# Patient Record
Sex: Female | Born: 1965
Health system: Southern US, Community
[De-identification: ages and names within clinical notes are randomized; demographics above are authoritative.]

## PROBLEM LIST (undated history)

## (undated) DIAGNOSIS — E785 Hyperlipidemia, unspecified: Secondary | ICD-10-CM

## (undated) DIAGNOSIS — C801 Malignant (primary) neoplasm, unspecified: Secondary | ICD-10-CM

## (undated) HISTORY — DX: Hyperlipidemia, unspecified: E78.5

---

## 2010-12-28 ENCOUNTER — Ambulatory Visit: Payer: Self-pay | Admitting: Obstetrics and Gynecology

## 2012-04-01 ENCOUNTER — Ambulatory Visit: Payer: Self-pay | Admitting: Obstetrics and Gynecology

## 2016-08-03 ENCOUNTER — Encounter (INDEPENDENT_AMBULATORY_CARE_PROVIDER_SITE_OTHER): Payer: Self-pay

## 2016-08-03 ENCOUNTER — Encounter: Payer: Self-pay | Admitting: Family Medicine

## 2016-08-03 ENCOUNTER — Ambulatory Visit (INDEPENDENT_AMBULATORY_CARE_PROVIDER_SITE_OTHER): Payer: BLUE CROSS/BLUE SHIELD | Admitting: Family Medicine

## 2016-08-03 VITALS — BP 110/80 | HR 78 | Ht 66.0 in | Wt 156.0 lb

## 2016-08-03 DIAGNOSIS — Z7689 Persons encountering health services in other specified circumstances: Secondary | ICD-10-CM

## 2016-08-03 DIAGNOSIS — Z7189 Other specified counseling: Secondary | ICD-10-CM | POA: Diagnosis not present

## 2016-08-03 NOTE — Progress Notes (Signed)
Name: Sandra Daugherty   MRN: [id number]    DOB: 10-22-1966   Date:08/03/2016       Progress Note  Subjective  Chief Complaint  Chief Complaint  Patient presents with  . Establish Care    was using [facility] and they closed    Patient presents for establish maintenance.    No problem-specific Assessment & Plan notes found for this encounter.   Past Medical History:  Diagnosis Date  . Hyperlipidemia     History reviewed. No pertinent surgical history.  Family History  Problem Relation Age of Onset  . Cancer Mother   . Stroke Mother   . Cancer Father   . Heart disease Maternal Grandfather     Social History   Social History  . Marital status: Married    Spouse name: N/A  . Number of children: N/A  . Years of education: N/A   Occupational History  . Not on file.   Social History Main Topics  . Smoking status: Never Smoker  . Smokeless tobacco: Never Used  . Alcohol use Yes  . Drug use: No  . Sexual activity: Yes   Other Topics Concern  . Not on file   Social History Narrative  . No narrative on file    No Known Allergies   Review of Systems  Constitutional: Negative for chills, fever, malaise/fatigue and weight loss.  HENT: Negative for ear discharge, ear pain and sore throat.   Eyes: Negative for blurred vision.  Respiratory: Negative for cough, sputum production, shortness of breath and wheezing.   Cardiovascular: Negative for chest pain, palpitations and leg swelling.  Gastrointestinal: Negative for abdominal pain, blood in stool, constipation, diarrhea, heartburn, melena and nausea.  Genitourinary: Negative for dysuria, frequency, hematuria and urgency.  Musculoskeletal: Negative for back pain, joint pain, myalgias and neck pain.  Skin: Negative for rash.  Neurological: Negative for dizziness, tingling, sensory change, focal weakness and headaches.  Endo/Heme/Allergies: Negative for environmental allergies and polydipsia. Does not  bruise/bleed easily.  Psychiatric/Behavioral: Negative for depression and suicidal ideas. The patient is not nervous/anxious and does not have insomnia.      Objective  Vitals:   08/03/16 1426  BP: 110/80  Pulse: 78  Weight: 156 lb (70.8 kg)  Height: 5\' 6"  (1.676 m)    Physical Exam  Constitutional: She is well-developed, well-nourished, and in no distress. No distress.  HENT:  Head: Normocephalic and atraumatic.  Right Ear: External ear normal.  Left Ear: External ear normal.  Nose: Nose normal.  Mouth/Throat: Oropharynx is clear and moist.  Eyes: Conjunctivae and EOM are normal. Pupils are equal, round, and reactive to light. Right eye exhibits no discharge. Left eye exhibits no discharge.  Neck: Normal range of motion. Neck supple. No JVD present. No thyromegaly present.  Cardiovascular: Normal rate, regular rhythm, normal heart sounds and intact distal pulses.  Exam reveals no gallop and no friction rub.   No murmur heard. Pulmonary/Chest: Effort normal and breath sounds normal. She has no wheezes. She has no rales.  Abdominal: Soft. Bowel sounds are normal. She exhibits no mass. There is no tenderness. There is no guarding.  Musculoskeletal: Normal range of motion. She exhibits no edema.  Lymphadenopathy:    She has no cervical adenopathy.  Neurological: She is alert. She has normal reflexes.  Skin: Skin is warm and dry. She is not diaphoretic.  Psychiatric: Mood and affect normal.  Nursing note and vitals reviewed.     Assessment & Plan  Problem List Items Addressed This Visit    None    Visit Diagnoses    Encounter to establish care    -  Primary        Dr. Otilio Miu [facility]  08/03/16

## 2016-08-16 ENCOUNTER — Encounter: Payer: Self-pay | Admitting: Family Medicine

## 2016-08-16 ENCOUNTER — Ambulatory Visit (INDEPENDENT_AMBULATORY_CARE_PROVIDER_SITE_OTHER): Payer: BLUE CROSS/BLUE SHIELD | Admitting: Family Medicine

## 2016-08-16 VITALS — BP 124/66 | HR 72 | Ht 66.0 in | Wt 156.0 lb

## 2016-08-16 DIAGNOSIS — Z1211 Encounter for screening for malignant neoplasm of colon: Secondary | ICD-10-CM | POA: Diagnosis not present

## 2016-08-16 DIAGNOSIS — Z0000 Encounter for general adult medical examination without abnormal findings: Secondary | ICD-10-CM | POA: Diagnosis not present

## 2016-08-16 DIAGNOSIS — Z23 Encounter for immunization: Secondary | ICD-10-CM | POA: Diagnosis not present

## 2016-08-16 DIAGNOSIS — Z1231 Encounter for screening mammogram for malignant neoplasm of breast: Secondary | ICD-10-CM | POA: Diagnosis not present

## 2016-08-16 DIAGNOSIS — Z1239 Encounter for other screening for malignant neoplasm of breast: Secondary | ICD-10-CM

## 2016-08-16 LAB — HEMOCCULT GUIAC POC 1CARD (OFFICE): Fecal Occult Blood, POC: NEGATIVE

## 2016-08-16 NOTE — Progress Notes (Signed)
Name: Sandra Daugherty   MRN: [id number]    DOB: [birth year]   Date:08/16/2016       Progress Note  Subjective  Chief Complaint  Chief Complaint  Patient presents with  . Annual Exam    no pap    Patient presents for annual physical exam.    No problem-specific Assessment & Plan notes found for this encounter.   Past Medical History:  Diagnosis Date  . Hyperlipidemia     History reviewed. No pertinent surgical history.  Family History  Problem Relation Age of Onset  . Cancer Mother   . Stroke Mother   . Cancer Father   . Heart disease Maternal Grandfather     Social History   Social History  . Marital status: Married    Spouse name: N/A  . Number of children: N/A  . Years of education: N/A   Occupational History  . Not on file.   Social History Main Topics  . Smoking status: Never Smoker  . Smokeless tobacco: Never Used  . Alcohol use Yes  . Drug use: No  . Sexual activity: Yes   Other Topics Concern  . Not on file   Social History Narrative  . No narrative on file    No Known Allergies   Review of Systems  Constitutional: Negative for chills, diaphoresis, fever, malaise/fatigue and weight loss.  HENT: Negative for congestion, ear discharge, ear pain, hearing loss, nosebleeds and tinnitus.   Eyes: Negative for blurred vision, double vision, photophobia, pain, discharge and redness.  Respiratory: Negative for cough, hemoptysis, sputum production, shortness of breath, wheezing and stridor.   Cardiovascular: Negative for chest pain, palpitations, orthopnea, claudication, leg swelling and PND.  Gastrointestinal: Negative for abdominal pain, blood in stool, constipation, diarrhea, heartburn, melena, nausea and vomiting.  Genitourinary: Negative for dysuria, flank pain, frequency, hematuria and urgency.  Musculoskeletal: Negative for back pain, falls, joint pain, myalgias and neck pain.  Skin: Negative for itching and rash.  Neurological: Negative  for dizziness, tingling, tremors, sensory change, speech change, focal weakness, seizures, loss of consciousness, weakness and headaches.  Endo/Heme/Allergies: Negative for environmental allergies and polydipsia. Does not bruise/bleed easily.  Psychiatric/Behavioral: Negative for depression, hallucinations, memory loss, substance abuse and suicidal ideas. The patient is not nervous/anxious and does not have insomnia.      Objective  Vitals:   08/16/16 0835  BP: 124/66  Pulse: 72  Weight: 156 lb (70.8 kg)  Height: 5\' 6"  (1.676 m)    Physical Exam  Constitutional: She is well-developed, well-nourished, and in no distress. No distress.  HENT:  Head: Normocephalic and atraumatic.  Right Ear: Tympanic membrane, external ear and ear canal normal.  Left Ear: Tympanic membrane, external ear and ear canal normal.  Nose: Nose normal.  Mouth/Throat: Uvula is midline, oropharynx is clear and moist and mucous membranes are normal.  Eyes: Conjunctivae, EOM and lids are normal. Pupils are equal, round, and reactive to light. Right eye exhibits no discharge. Left eye exhibits no discharge.  Fundoscopic exam:      The right eye shows no arteriolar narrowing, no AV nicking and no papilledema.       The left eye shows no arteriolar narrowing, no AV nicking and no papilledema.  Neck: Trachea normal, normal range of motion and phonation normal. Neck supple. Normal carotid pulses, no hepatojugular reflux and no JVD present. No spinous process tenderness present. Carotid bruit is not present. No thyroid mass and no thyromegaly present.  Cardiovascular: Normal rate,  regular rhythm, S1 normal and intact distal pulses.  Exam reveals no gallop, no S3, no S4, no distant heart sounds and no friction rub.   Murmur heard.  Systolic murmur is present with a grade of 1/6  Pulmonary/Chest: Effort normal and breath sounds normal. Right breast exhibits no inverted nipple, no mass, no nipple discharge, no skin change and  no tenderness. Left breast exhibits no inverted nipple, no mass, no nipple discharge, no skin change and no tenderness. Breasts are asymmetrical.  L>R  Right seems more dense  Abdominal: Soft. Bowel sounds are normal. She exhibits no mass. There is no hepatosplenomegaly, splenomegaly or hepatomegaly. There is no tenderness. There is no guarding and no CVA tenderness.  Genitourinary: Rectum normal. Rectal exam shows no external hemorrhoid.  Musculoskeletal: Normal range of motion. She exhibits no edema.  Lymphadenopathy:       Head (right side): No submental and no submandibular adenopathy present.       Head (left side): No submental and no submandibular adenopathy present.    She has no cervical adenopathy.    She has no axillary adenopathy.  Neurological: She is alert. She has normal strength, normal reflexes and intact cranial nerves.  Skin: Skin is warm and dry. She is not diaphoretic.  Psychiatric: Mood and affect normal.  Nursing note and vitals reviewed.     Assessment & Plan  Problem List Items Addressed This Visit    None    Visit Diagnoses    Annual physical exam    -  Primary   Relevant Orders   Flu Vaccine QUAD 36+ mos PF IM (Fluarix & Fluzone Quad PF) (Completed)   Renal Function Panel   Lipid Profile   Immunization due       Relevant Orders   Flu Vaccine QUAD 36+ mos PF IM (Fluarix & Fluzone Quad PF) (Completed)   Colon cancer screening       Relevant Orders   Ambulatory referral to Gastroenterology   POCT Occult Blood Stool (Completed)   Breast cancer screening       Relevant Orders   MM Digital Screening        Dr. Otilio Miu [facility]  08/16/16

## 2016-08-17 LAB — RENAL FUNCTION PANEL
ALBUMIN: 4.6 g/dL (ref 3.5–5.5)
BUN/Creatinine Ratio: 11 (ref 9–23)
BUN: 8 mg/dL (ref 6–24)
CALCIUM: 9.3 mg/dL (ref 8.7–10.2)
CO2: 26 mmol/L (ref 18–29)
CREATININE: 0.74 mg/dL (ref 0.57–1.00)
Chloride: 101 mmol/L (ref 96–106)
GFR calc Af Amer: 109 mL/min/{1.73_m2} (ref >59)
GFR, EST NON AFRICAN AMERICAN: 95 mL/min/{1.73_m2} (ref >59)
GLUCOSE: 77 mg/dL (ref 65–99)
PHOSPHORUS: 4.1 mg/dL (ref 2.5–4.5)
POTASSIUM: 4.5 mmol/L (ref 3.5–5.2)
Sodium: 142 mmol/L (ref 134–144)

## 2016-08-17 LAB — LIPID PANEL
CHOL/HDL RATIO: 4.4 ratio (ref 0.0–4.4)
CHOLESTEROL TOTAL: 224 mg/dL — AB (ref 100–199)
HDL: 51 mg/dL (ref >39)
LDL Calculated: 141 mg/dL — ABNORMAL HIGH (ref 0–99)
TRIGLYCERIDES: 159 mg/dL — AB (ref 0–149)
VLDL Cholesterol Cal: 32 mg/dL (ref 5–40)

## 2016-08-21 ENCOUNTER — Other Ambulatory Visit: Payer: Self-pay | Admitting: *Deleted

## 2016-08-21 ENCOUNTER — Inpatient Hospital Stay
Admission: RE | Admit: 2016-08-21 | Discharge: 2016-08-21 | Disposition: A | Discharge status: Home / Self Care | Payer: Self-pay | Source: Ambulatory Visit | Attending: *Deleted | Admitting: *Deleted

## 2016-08-21 DIAGNOSIS — Z9289 Personal history of other medical treatment: Secondary | ICD-10-CM

## 2016-08-29 ENCOUNTER — Ambulatory Visit
Admission: RE | Admit: 2016-08-29 | Discharge: 2016-08-29 | Disposition: A | Discharge status: Home / Self Care | Payer: BLUE CROSS/BLUE SHIELD | Source: Ambulatory Visit | Attending: Family Medicine | Admitting: Family Medicine

## 2016-08-29 DIAGNOSIS — Z1231 Encounter for screening mammogram for malignant neoplasm of breast: Secondary | ICD-10-CM | POA: Diagnosis not present

## 2016-08-29 DIAGNOSIS — Z1239 Encounter for other screening for malignant neoplasm of breast: Secondary | ICD-10-CM

## 2017-06-03 ENCOUNTER — Ambulatory Visit (INDEPENDENT_AMBULATORY_CARE_PROVIDER_SITE_OTHER): Payer: BLUE CROSS/BLUE SHIELD | Admitting: Family Medicine

## 2017-06-03 ENCOUNTER — Encounter: Payer: Self-pay | Admitting: Family Medicine

## 2017-06-03 VITALS — BP 112/80 | HR 80 | Ht 66.0 in | Wt 159.0 lb

## 2017-06-03 DIAGNOSIS — M21612 Bunion of left foot: Secondary | ICD-10-CM | POA: Diagnosis not present

## 2017-06-03 NOTE — Progress Notes (Signed)
Name: Sandra Daugherty   MRN: [id number]    DOB: 07-06-1966   Date:06/03/2017       Progress Note  Subjective  Chief Complaint  Chief Complaint  Patient presents with  . Foot Pain    has a bunion on outside of L) great toe that hurts when trying to wear shoes that enclose the foot    Foot Pain  This is a chronic problem. The current episode started more than 1 year ago. The problem occurs constantly. The problem has been gradually worsening. Associated symptoms include arthralgias and joint swelling. Pertinent negatives include no abdominal pain, chest pain, chills, coughing, fever, headaches, myalgias, nausea, neck pain, numbness, rash, sore throat or weakness. The symptoms are aggravated by standing, exertion and walking. She has tried NSAIDs and acetaminophen for the symptoms. The treatment provided no relief.    No problem-specific Assessment & Plan notes found for this encounter.   Past Medical History:  Diagnosis Date  . Hyperlipidemia     No past surgical history on file.  Family History  Problem Relation Age of Onset  . Cancer Mother   . Stroke Mother   . Cancer Father   . Heart disease Maternal Grandfather   . Breast cancer Maternal Grandmother     Social History   Social History  . Marital status: Married    Spouse name: N/A  . Number of children: N/A  . Years of education: N/A   Occupational History  . Not on file.   Social History Main Topics  . Smoking status: Never Smoker  . Smokeless tobacco: Never Used  . Alcohol use Yes  . Drug use: No  . Sexual activity: Yes   Other Topics Concern  . Not on file   Social History Narrative  . No narrative on file    No Known Allergies  No outpatient prescriptions prior to visit.   No facility-administered medications prior to visit.     Review of Systems  Constitutional: Negative for chills, fever, malaise/fatigue and weight loss.  HENT: Negative for ear discharge, ear pain and sore throat.     Eyes: Negative for blurred vision.  Respiratory: Negative for cough, sputum production, shortness of breath and wheezing.   Cardiovascular: Negative for chest pain, palpitations and leg swelling.  Gastrointestinal: Negative for abdominal pain, blood in stool, constipation, diarrhea, heartburn, melena and nausea.  Genitourinary: Negative for dysuria, frequency, hematuria and urgency.  Musculoskeletal: Positive for arthralgias, joint pain and joint swelling. Negative for back pain, falls, myalgias and neck pain.  Skin: Negative for rash.  Neurological: Negative for dizziness, tingling, sensory change, focal weakness, weakness, numbness and headaches.  Endo/Heme/Allergies: Negative for environmental allergies and polydipsia. Does not bruise/bleed easily.  Psychiatric/Behavioral: Negative for depression and suicidal ideas. The patient is not nervous/anxious and does not have insomnia.      Objective  Vitals:   06/03/17 1354  BP: 112/80  Pulse: 80  Weight: 159 lb (72.1 kg)  Height: 5\' 6"  (1.676 m)    Physical Exam  Constitutional: She is well-developed, well-nourished, and in no distress. No distress.  HENT:  Head: Normocephalic and atraumatic.  Right Ear: External ear normal.  Left Ear: External ear normal.  Nose: Nose normal.  Mouth/Throat: Oropharynx is clear and moist.  Eyes: Pupils are equal, round, and reactive to light. Conjunctivae and EOM are normal. Right eye exhibits no discharge. Left eye exhibits no discharge.  Neck: Normal range of motion. Neck supple. No JVD present. No thyromegaly  present.  Cardiovascular: Normal rate, regular rhythm, normal heart sounds and intact distal pulses.  Exam reveals no gallop and no friction rub.   No murmur heard. Pulmonary/Chest: Effort normal and breath sounds normal. She has no wheezes. She has no rales.  Abdominal: Soft. Bowel sounds are normal. She exhibits no mass. There is no tenderness. There is no guarding.  Musculoskeletal:  Normal range of motion. She exhibits no edema.       Feet:  Lymphadenopathy:    She has no cervical adenopathy.  Neurological: She is alert. She has normal reflexes.  Skin: Skin is warm and dry. She is not diaphoretic.  Psychiatric: Mood and affect normal.  Nursing note and vitals reviewed.     Assessment & Plan  Problem List Items Addressed This Visit    None    Visit Diagnoses    Bunion of great toe of left foot    -  Primary   Relevant Orders   Ambulatory referral to Podiatry      No orders of the defined types were placed in this encounter.     Dr. [facility]  06/03/17

## 2017-06-03 NOTE — Patient Instructions (Signed)

## 2018-02-10 ENCOUNTER — Encounter: Payer: Self-pay | Admitting: Family Medicine

## 2018-02-10 ENCOUNTER — Ambulatory Visit (INDEPENDENT_AMBULATORY_CARE_PROVIDER_SITE_OTHER): Payer: BLUE CROSS/BLUE SHIELD | Admitting: Family Medicine

## 2018-02-10 VITALS — BP 120/70 | HR 76 | Ht 66.0 in | Wt 160.0 lb

## 2018-02-10 DIAGNOSIS — E785 Hyperlipidemia, unspecified: Secondary | ICD-10-CM

## 2018-02-10 DIAGNOSIS — Z1211 Encounter for screening for malignant neoplasm of colon: Secondary | ICD-10-CM | POA: Diagnosis not present

## 2018-02-10 DIAGNOSIS — Z124 Encounter for screening for malignant neoplasm of cervix: Secondary | ICD-10-CM | POA: Diagnosis not present

## 2018-02-10 DIAGNOSIS — Z0000 Encounter for general adult medical examination without abnormal findings: Secondary | ICD-10-CM | POA: Diagnosis not present

## 2018-02-10 DIAGNOSIS — Z23 Encounter for immunization: Secondary | ICD-10-CM | POA: Diagnosis not present

## 2018-02-10 DIAGNOSIS — Z1239 Encounter for other screening for malignant neoplasm of breast: Secondary | ICD-10-CM

## 2018-02-10 DIAGNOSIS — Z1231 Encounter for screening mammogram for malignant neoplasm of breast: Secondary | ICD-10-CM | POA: Diagnosis not present

## 2018-02-10 NOTE — Progress Notes (Signed)
Name: Sandra Daugherty   MRN: [id number]    DOB: Jun 07, 1966   Date:02/10/2018       Progress Note  Subjective  Chief Complaint  Chief Complaint  Patient presents with  . Annual Exam    pap and mammo needed, ref to colonoscopy    Patient presents for annual physical with pap and pelvic/breast exams.   No problem-specific Assessment & Plan notes found for this encounter.   Past Medical History:  Diagnosis Date  . Hyperlipidemia     No past surgical history on file.  Family History  Problem Relation Age of Onset  . Cancer Mother   . Stroke Mother   . Cancer Father   . Heart disease Maternal Grandfather   . Breast cancer Maternal Grandmother     Social History   Socioeconomic History  . Marital status: Married    Spouse name: Not on file  . Number of children: Not on file  . Years of education: Not on file  . Highest education level: Not on file  Occupational History  . Not on file  Social Needs  . Financial resource strain: Not on file  . Food insecurity:    Worry: Not on file    Inability: Not on file  . Transportation needs:    Medical: Not on file    Non-medical: Not on file  Tobacco Use  . Smoking status: Never Smoker  . Smokeless tobacco: Never Used  Substance and Sexual Activity  . Alcohol use: Yes  . Drug use: No  . Sexual activity: Yes  Lifestyle  . Physical activity:    Days per week: Not on file    Minutes per session: Not on file  . Stress: Not on file  Relationships  . Social connections:    Talks on phone: Not on file    Gets together: Not on file    Attends religious service: Not on file    Active member of club or organization: Not on file    Attends meetings of clubs or organizations: Not on file    Relationship status: Not on file  . Intimate partner violence:    Fear of current or ex partner: Not on file    Emotionally abused: Not on file    Physically abused: Not on file    Forced sexual activity: Not on file  Other Topics  Concern  . Not on file  Social History Narrative  . Not on file    No Known Allergies  Outpatient Medications Prior to Visit  Medication Sig Dispense Refill  . calcium-vitamin D 250-100 MG-UNIT tablet Take 1 tablet by mouth 2 (two) times daily.    . Multiple Vitamin (MULTIVITAMIN) tablet Take 1 tablet by mouth daily.     No facility-administered medications prior to visit.     Review of Systems  Constitutional: Negative for chills, fever, malaise/fatigue and weight loss.  HENT: Negative for ear discharge, ear pain and sore throat.   Eyes: Negative for blurred vision.  Respiratory: Negative for cough, sputum production, shortness of breath and wheezing.   Cardiovascular: Negative for chest pain, palpitations and leg swelling.  Gastrointestinal: Negative for abdominal pain, blood in stool, constipation, diarrhea, heartburn, melena and nausea.  Genitourinary: Negative for dysuria, frequency, hematuria and urgency.  Musculoskeletal: Negative for back pain, joint pain, myalgias and neck pain.  Skin: Negative for rash.  Neurological: Negative for dizziness, tingling, sensory change, focal weakness and headaches.  Endo/Heme/Allergies: Negative for environmental allergies and  polydipsia. Does not bruise/bleed easily.  Psychiatric/Behavioral: Negative for depression and suicidal ideas. The patient is not nervous/anxious and does not have insomnia.      Objective  Vitals:   02/10/18 0846  BP: 120/70  Pulse: 76  Weight: 160 lb (72.6 kg)  Height: 5\' 6"  (1.676 m)    Physical Exam  Constitutional: She is oriented to person, place, and time and well-developed, well-nourished, and in no distress. Vital signs are normal. No distress.  HENT:  Head: Normocephalic and atraumatic.  Right Ear: Hearing, tympanic membrane, external ear and ear canal normal.  Left Ear: Hearing, tympanic membrane, external ear and ear canal normal.  Nose: Nose normal.  Mouth/Throat: Uvula is midline,  oropharynx is clear and moist and mucous membranes are normal. No oropharyngeal exudate, posterior oropharyngeal edema, posterior oropharyngeal erythema or tonsillar abscesses.  Eyes: Pupils are equal, round, and reactive to light. Conjunctivae, EOM and lids are normal. Right eye exhibits no discharge. Left eye exhibits no discharge.  Fundoscopic exam:      The right eye shows no arteriolar narrowing and no AV nicking.       The left eye shows no arteriolar narrowing and no AV nicking.  Neck: Trachea normal and normal range of motion. Neck supple. Normal carotid pulses, no hepatojugular reflux and no JVD present. Carotid bruit is not present. No thyroid mass and no thyromegaly present.  Cardiovascular: Normal rate, regular rhythm, S1 normal, S2 normal, normal heart sounds, intact distal pulses and normal pulses. PMI is not displaced. Exam reveals no gallop, no S3, no S4 and no friction rub.  No murmur heard. Pulses:      Carotid pulses are 2+ on the right side, and 2+ on the left side.      Radial pulses are 2+ on the right side, and 2+ on the left side.       Femoral pulses are 2+ on the right side, and 2+ on the left side.      Popliteal pulses are 2+ on the right side, and 2+ on the left side.       Dorsalis pedis pulses are 2+ on the right side, and 2+ on the left side.       Posterior tibial pulses are 2+ on the right side, and 2+ on the left side.  Pulmonary/Chest: Effort normal and breath sounds normal. No accessory muscle usage. No respiratory distress. She has no wheezes. She has no rales. Right breast exhibits no inverted nipple, no mass, no nipple discharge, no skin change and no tenderness. Left breast exhibits no inverted nipple, no mass, no nipple discharge, no skin change and no tenderness. Breasts are symmetrical.  Abdominal: Soft. Normal aorta and bowel sounds are normal. She exhibits no mass. There is no hepatosplenomegaly. There is no tenderness. There is no rigidity, no rebound,  no guarding and no CVA tenderness. No hernia.  Genitourinary: Rectum normal, vagina normal, uterus normal, cervix normal, right adnexa normal, left adnexa normal and vulva normal. Rectal exam shows no external hemorrhoid and guaiac negative stool.  Musculoskeletal: Normal range of motion. She exhibits no edema.       Cervical back: Normal.       Thoracic back: Normal.       Lumbar back: Normal.  Lymphadenopathy:       Head (right side): No submental and no submandibular adenopathy present.       Head (left side): No submental and no submandibular adenopathy present.    She has  no cervical adenopathy.    She has no axillary adenopathy.  Neurological: She is alert and oriented to person, place, and time. She has normal motor skills, normal sensation, normal strength, normal reflexes and intact cranial nerves.  Skin: Skin is warm, dry and intact. She is not diaphoretic. No pallor.  Wart/right ankle  Psychiatric: Mood and affect normal.  Nursing note and vitals reviewed.     Assessment & Plan  Problem List Items Addressed This Visit    None    Visit Diagnoses    Annual physical exam    -  Primary   Relevant Orders   Ambulatory referral to Gastroenterology   Renal Function Panel   Pap IG (Image Guided)   Breast cancer screening       Relevant Orders   MM Digital Screening   Colon cancer screening       Need for diphtheria-tetanus-pertussis (Tdap) vaccine       Relevant Orders   Tdap vaccine greater than or equal to 7yo IM (Completed)   Hyperlipidemia, unspecified hyperlipidemia type       Relevant Orders   Lipid panel   Cervical cancer screening       Relevant Orders   Pap IG (Image Guided)      No orders of the defined types were placed in this encounter. LASHAWNDRA LAMPKINS is a 52 y.o. female who presents today for her Complete Annual Exam. She feels well. She reports exercising . She reports she is sleeping well.  Immunizations are reviewed and recommendations  provided.   Age appropriate screening tests are discussed. Counseling given for risk factor reduction interventions.   Dr. [facility]  02/10/18

## 2018-02-11 LAB — LIPID PANEL
CHOL/HDL RATIO: 4 ratio (ref 0.0–4.4)
Cholesterol, Total: 218 mg/dL — ABNORMAL HIGH (ref 100–199)
HDL: 55 mg/dL (ref >39)
LDL CALC: 142 mg/dL — AB (ref 0–99)
Triglycerides: 104 mg/dL (ref 0–149)
VLDL Cholesterol Cal: 21 mg/dL (ref 5–40)

## 2018-02-11 LAB — RENAL FUNCTION PANEL
Albumin: 4.4 g/dL (ref 3.5–5.5)
BUN / CREAT RATIO: 9 (ref 9–23)
BUN: 7 mg/dL (ref 6–24)
CO2: 26 mmol/L (ref 20–29)
CREATININE: 0.77 mg/dL (ref 0.57–1.00)
Calcium: 9.4 mg/dL (ref 8.7–10.2)
Chloride: 101 mmol/L (ref 96–106)
GFR, EST AFRICAN AMERICAN: 103 mL/min/{1.73_m2} (ref >59)
GFR, EST NON AFRICAN AMERICAN: 89 mL/min/{1.73_m2} (ref >59)
Glucose: 75 mg/dL (ref 65–99)
Phosphorus: 3.9 mg/dL (ref 2.5–4.5)
Potassium: 4.2 mmol/L (ref 3.5–5.2)
SODIUM: 140 mmol/L (ref 134–144)

## 2018-02-12 LAB — PAP IG (IMAGE GUIDED): PAP SMEAR COMMENT: 0

## 2018-02-18 ENCOUNTER — Encounter (INDEPENDENT_AMBULATORY_CARE_PROVIDER_SITE_OTHER): Payer: Self-pay

## 2018-02-18 ENCOUNTER — Ambulatory Visit
Admission: RE | Admit: 2018-02-18 | Discharge: 2018-02-18 | Disposition: A | Discharge status: Home / Self Care | Payer: BLUE CROSS/BLUE SHIELD | Source: Ambulatory Visit | Attending: Family Medicine | Admitting: Family Medicine

## 2018-02-18 ENCOUNTER — Ambulatory Visit: Payer: BLUE CROSS/BLUE SHIELD

## 2018-02-18 DIAGNOSIS — Z1239 Encounter for other screening for malignant neoplasm of breast: Secondary | ICD-10-CM

## 2018-02-18 DIAGNOSIS — Z1231 Encounter for screening mammogram for malignant neoplasm of breast: Secondary | ICD-10-CM | POA: Diagnosis present

## 2018-02-18 HISTORY — DX: Malignant (primary) neoplasm, unspecified: C80.1

## 2018-03-10 ENCOUNTER — Encounter: Payer: Self-pay | Admitting: *Deleted

## 2019-01-06 IMAGING — MG MM DIGITAL SCREENING BILAT W/ CAD
4 series · 4 of 4 positions shown · non-contrast
Comparison: Previous exam(s).

CLINICAL DATA: Screening.

EXAM:
DIGITAL SCREENING BILATERAL MAMMOGRAM WITH CAD

[L MLO]
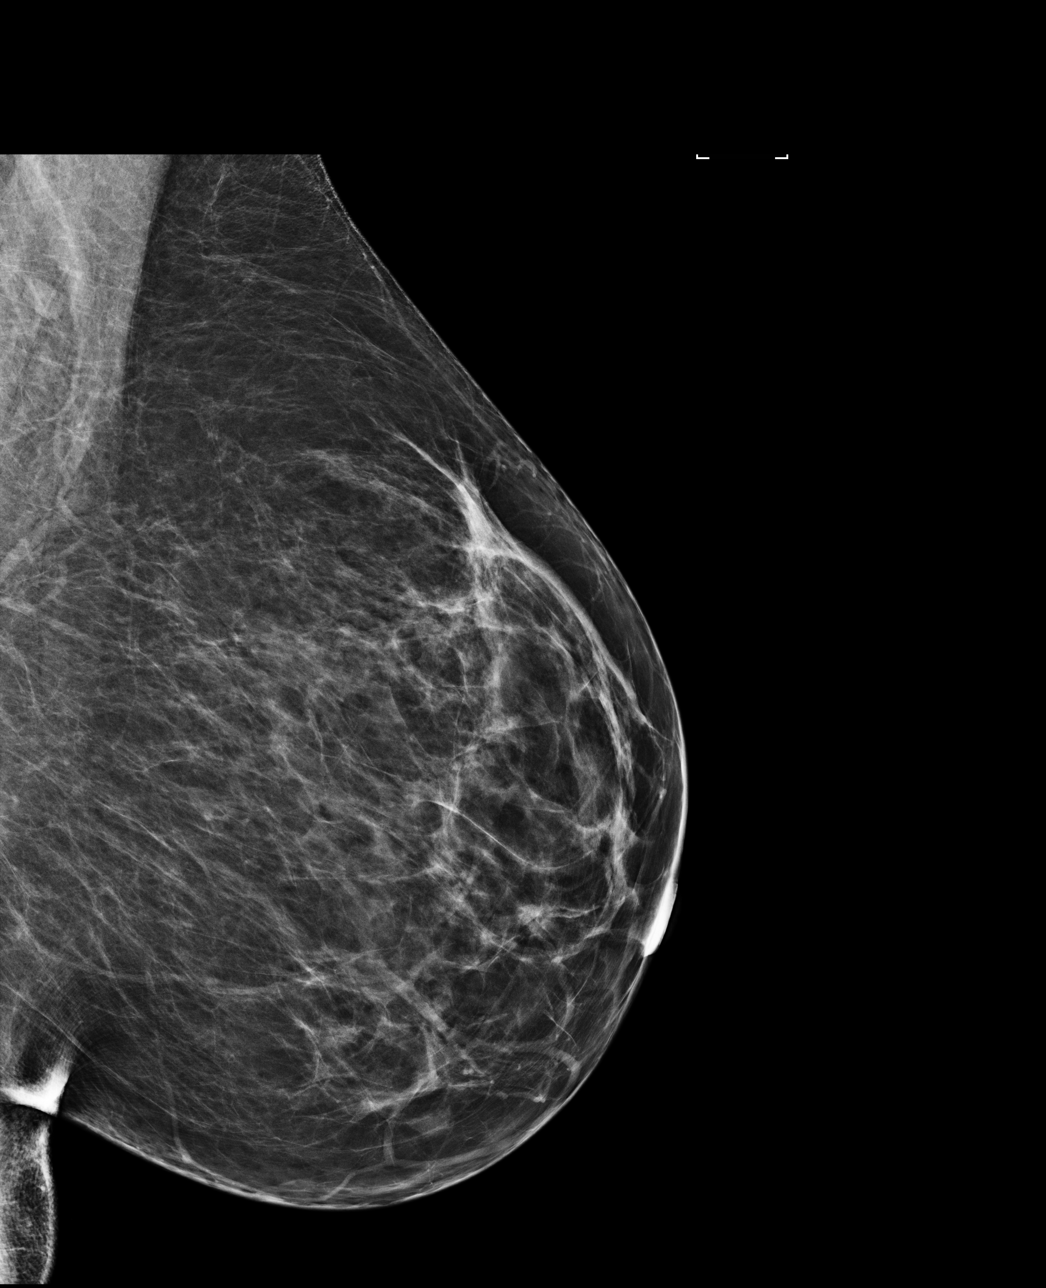

[L CC]
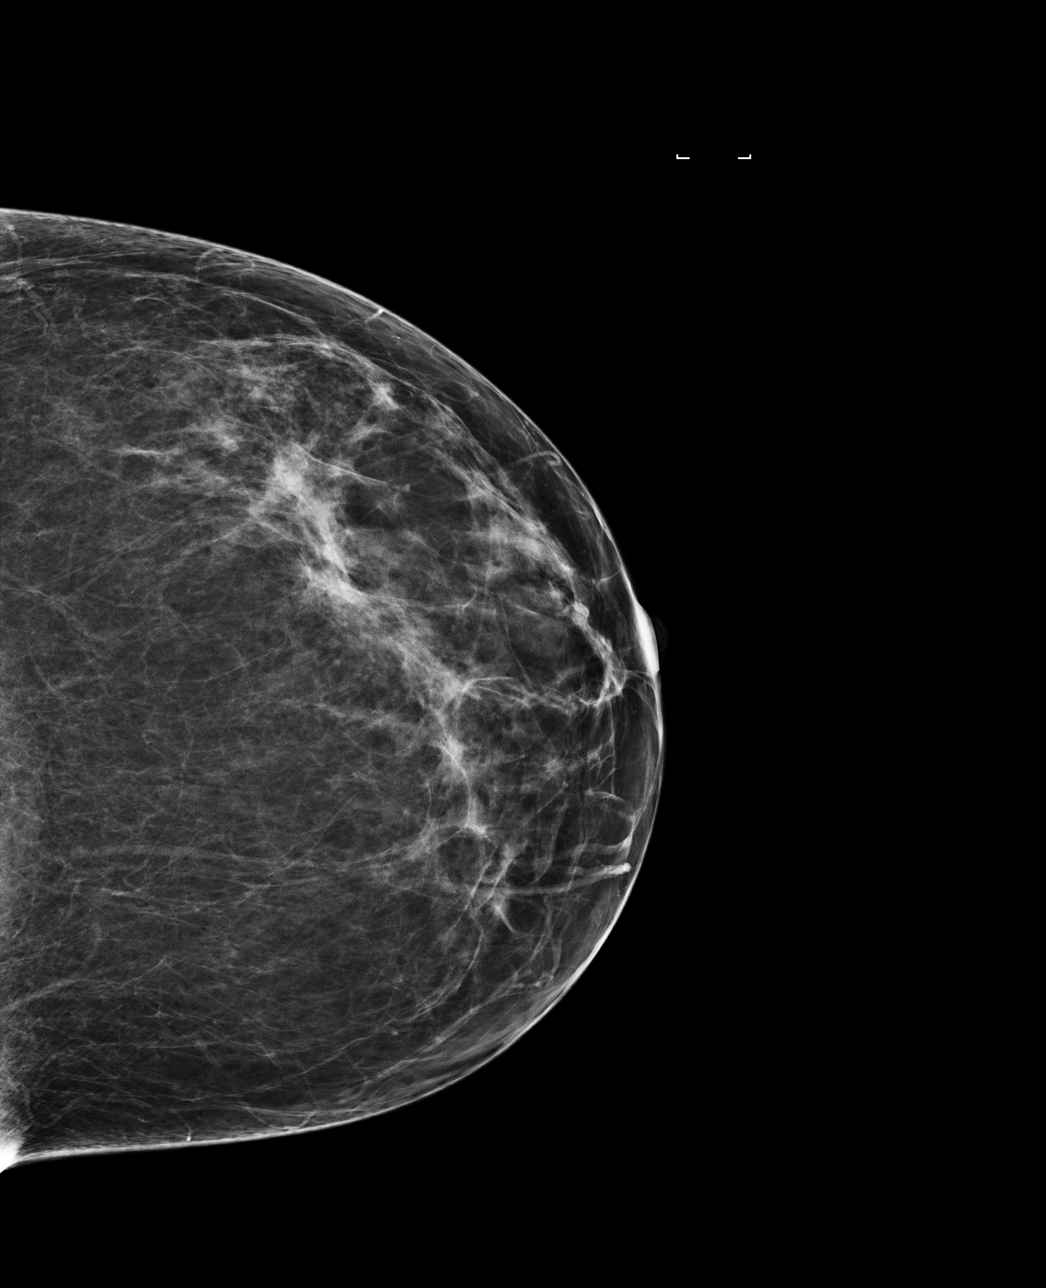

[R MLO]
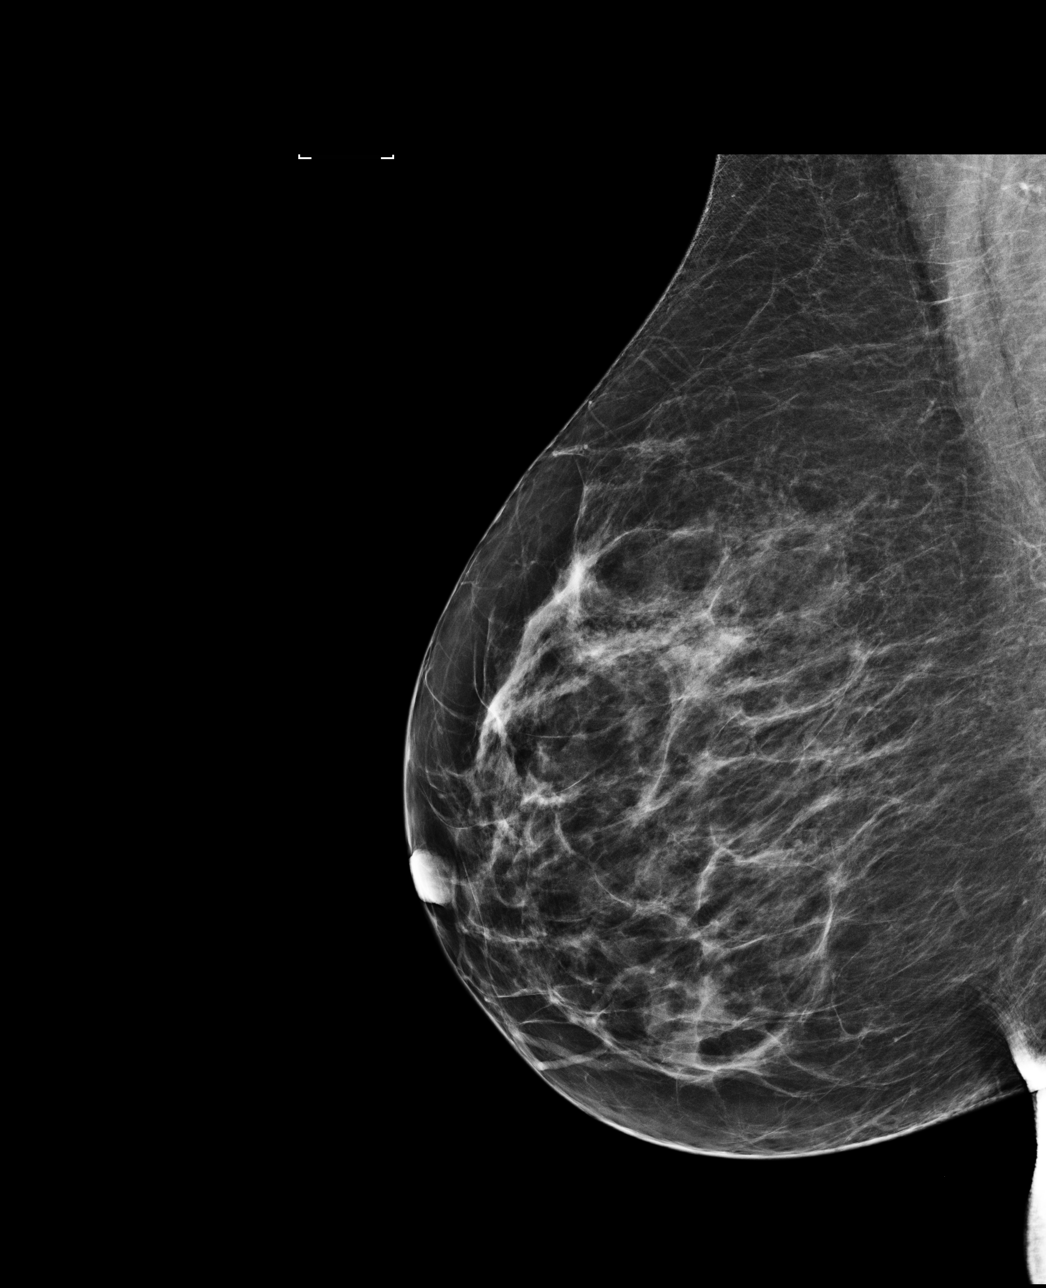

[R CC]
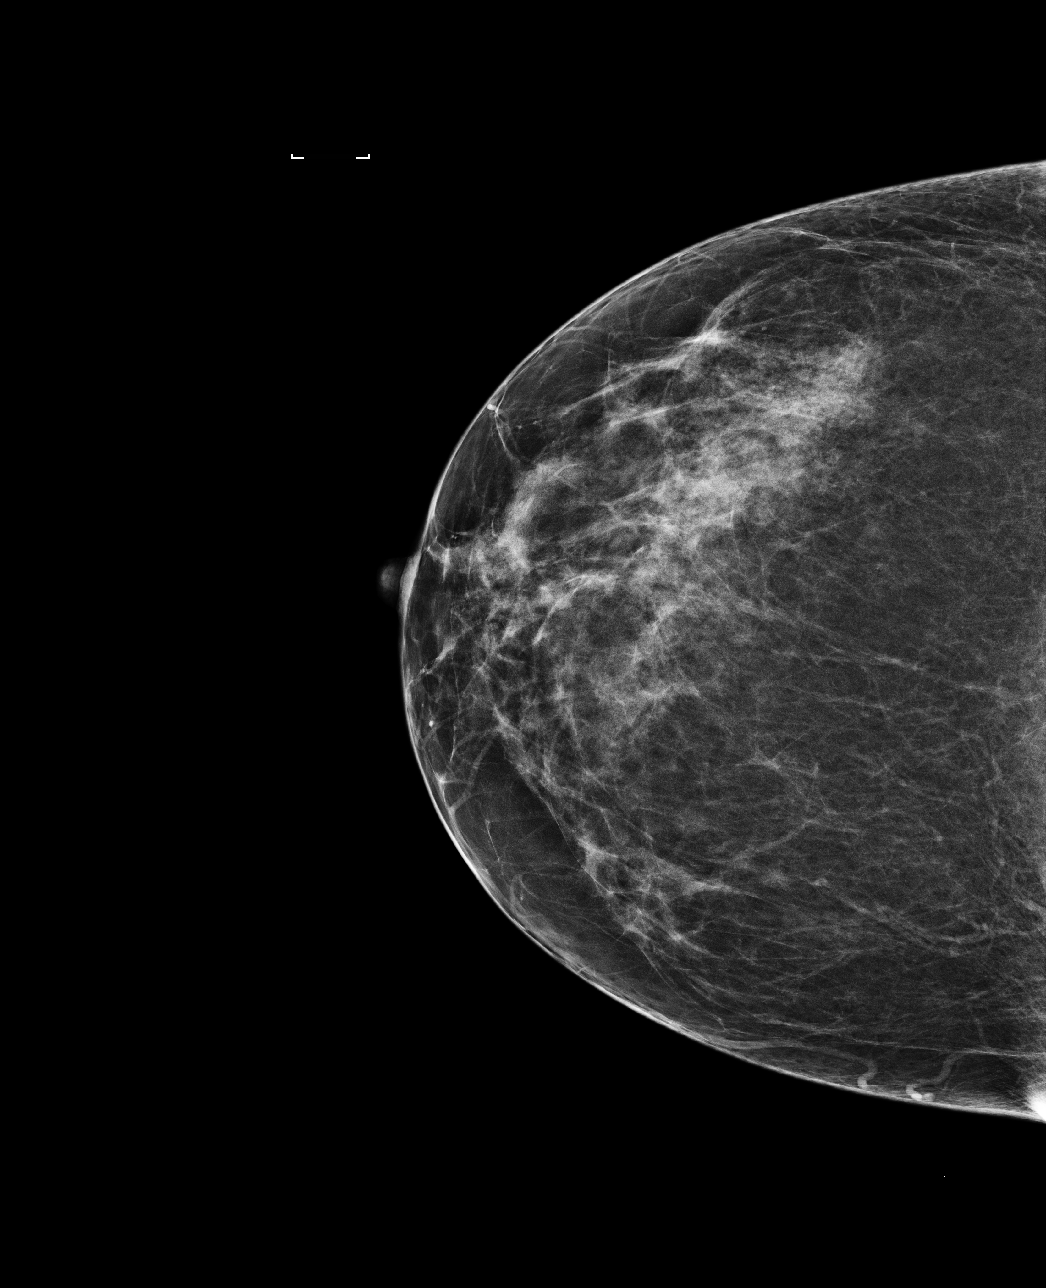

[4 of 4 positions shown; findings below may reference images not displayed]

ACR Breast Density Category b: There are scattered areas of
fibroglandular density.
FINDINGS: There are no findings suspicious for malignancy. Images were
processed with CAD.
IMPRESSION: No mammographic evidence of malignancy. A result letter of this
screening mammogram will be mailed directly to the patient.

RECOMMENDATION:
Screening mammogram in one year. (Code:AS-G-LCT)

BI-RADS CATEGORY  1: Negative.

## 2019-02-12 ENCOUNTER — Encounter: Payer: BLUE CROSS/BLUE SHIELD | Admitting: Family Medicine

## 2019-05-12 ENCOUNTER — Other Ambulatory Visit: Payer: Self-pay

## 2019-05-12 ENCOUNTER — Ambulatory Visit (INDEPENDENT_AMBULATORY_CARE_PROVIDER_SITE_OTHER): Payer: BLUE CROSS/BLUE SHIELD | Admitting: Family Medicine

## 2019-05-12 ENCOUNTER — Encounter: Payer: Self-pay | Admitting: Family Medicine

## 2019-05-12 VITALS — BP 110/70 | HR 88 | Ht 66.0 in | Wt 147.0 lb

## 2019-05-12 DIAGNOSIS — Z0000 Encounter for general adult medical examination without abnormal findings: Secondary | ICD-10-CM | POA: Diagnosis not present

## 2019-05-12 DIAGNOSIS — Z1231 Encounter for screening mammogram for malignant neoplasm of breast: Secondary | ICD-10-CM

## 2019-05-12 DIAGNOSIS — L409 Psoriasis, unspecified: Secondary | ICD-10-CM

## 2019-05-12 MED ORDER — CLOBETASOL PROPIONATE 0.05 % EX CREA: 1.0000 "application " | TOPICAL_CREAM | Freq: Two times a day (BID) | CUTANEOUS | 1 refills | Status: AC

## 2019-05-12 NOTE — Progress Notes (Signed)
Date:  05/12/2019   Name:  Sandra Daugherty   DOB:  [birth year]   MRN:  [id number]   Chief Complaint: Annual Exam and Psoriasis (wants clobetasol cream for her flare ups)  Patient is a 53 year old female who presents for a comprehensive physical exam. The patient reports the following problems: psoriasis. Health maintenance has been reviewed up to date  Psoriasis This is a chronic problem. The current episode started more than 1 year ago. The problem occurs intermittently. The problem has been gradually worsening since onset. The affected locations include the neck and scalp. Pertinent negatives include no itching, pain, plaques or pustules. The symptoms are aggravated by stress. Past treatments include topical steroids. The treatment provided moderate relief.    Review of Systems  Constitutional: Negative for chills and fever.  HENT: Negative for drooling, ear discharge, ear pain and sore throat.   Respiratory: Negative for cough, shortness of breath and wheezing.   Cardiovascular: Negative for chest pain, palpitations and leg swelling.  Gastrointestinal: Negative for abdominal pain, blood in stool, constipation, diarrhea and nausea.  Endocrine: Negative for polydipsia.  Genitourinary: Negative for dysuria, frequency, hematuria and urgency.  Musculoskeletal: Negative for back pain, myalgias and neck pain.  Skin: Negative for itching and rash.  Allergic/Immunologic: Negative for environmental allergies.  Neurological: Negative for dizziness and headaches.  Hematological: Does not bruise/bleed easily.  Psychiatric/Behavioral: Negative for suicidal ideas. The patient is not nervous/anxious.     There are no active problems to display for this patient.   No Known Allergies  No past surgical history on file.  Social History   Tobacco Use  . Smoking status: Never Smoker  . Smokeless tobacco: Never Used  Substance Use Topics  . Alcohol use: Yes  . Drug use: No      Medication list has been reviewed and updated.  Current Meds  Medication Sig  . calcium-vitamin D 250-100 MG-UNIT tablet Take 1 tablet by mouth 2 (two) times daily.  . Multiple Vitamin (MULTIVITAMIN) tablet Take 1 tablet by mouth daily.    PHQ 2/9 Scores 05/12/2019 06/03/2017  PHQ - 2 Score 0 1  PHQ- 9 Score 0 -    BP Readings from Last 3 Encounters:  05/12/19 110/70  02/10/18 120/70  06/03/17 112/80    Physical Exam Vitals signs and nursing note reviewed.  Constitutional:      General: She is awake.     Appearance: Normal appearance. She is well-developed and normal weight.  HENT:     Head: Normocephalic.     Jaw: There is normal jaw occlusion.     Right Ear: Hearing, tympanic membrane, ear canal and external ear normal.     Left Ear: Hearing, tympanic membrane, ear canal and external ear normal.     Nose: Nose normal.     Mouth/Throat:     Lips: Pink.     Mouth: Mucous membranes are moist.     Dentition: Normal dentition.     Tongue: No lesions. Tongue does not deviate from midline.     Pharynx: Oropharynx is clear. Uvula midline.     Tonsils: No tonsillar exudate or tonsillar abscesses.  Eyes:     General: Lids are everted, no foreign bodies appreciated. Vision grossly intact. Gaze aligned appropriately. No scleral icterus.       Left eye: No foreign body or hordeolum.     Extraocular Movements: Extraocular movements intact.     Conjunctiva/sclera: Conjunctivae normal.  Right eye: Right conjunctiva is not injected.     Left eye: Left conjunctiva is not injected.     Pupils: Pupils are equal, round, and reactive to light.  Neck:     Musculoskeletal: Full passive range of motion without pain, normal range of motion and neck supple. No neck rigidity.     Thyroid: No thyroid mass, thyromegaly or thyroid tenderness.     Vascular: Normal carotid pulses. No carotid bruit, hepatojugular reflux or JVD.     Trachea: Trachea and phonation normal. No tracheal deviation.   Cardiovascular:     Rate and Rhythm: Normal rate and regular rhythm.  No extrasystoles are present.    Chest Wall: PMI is not displaced.     Pulses: Normal pulses.          Carotid pulses are 2+ on the right side and 2+ on the left side.      Radial pulses are 2+ on the right side and 2+ on the left side.       Femoral pulses are 2+ on the right side and 2+ on the left side.      Popliteal pulses are 2+ on the right side and 2+ on the left side.       Dorsalis pedis pulses are 2+ on the right side and 2+ on the left side.       Posterior tibial pulses are 2+ on the right side and 2+ on the left side.     Heart sounds: Normal heart sounds, S1 normal and S2 normal. No murmur. No systolic murmur. No diastolic murmur. No friction rub. No gallop. No S3 or S4 sounds.   Pulmonary:     Effort: Pulmonary effort is normal. No respiratory distress.     Breath sounds: Normal breath sounds and air entry. No decreased air movement or transmitted upper airway sounds. No decreased breath sounds, wheezing, rhonchi or rales.  Chest:     Breasts:        Right: Normal. No swelling, bleeding, inverted nipple, mass, nipple discharge, skin change or tenderness.        Left: Normal. No swelling, bleeding, inverted nipple, mass, nipple discharge, skin change or tenderness.  Abdominal:     General: Abdomen is flat. Bowel sounds are normal.     Palpations: Abdomen is soft. There is no hepatomegaly, splenomegaly or mass.     Tenderness: There is no abdominal tenderness. There is no guarding or rebound.     Hernia: There is no hernia in the umbilical area or ventral area.  Genitourinary:    Rectum: Normal. Guaiac result negative. No mass.  Musculoskeletal: Normal range of motion.        General: No tenderness.     Right lower leg: No edema.     Left lower leg: No edema.  Lymphadenopathy:     Head:     Right side of head: No submental, submandibular or tonsillar adenopathy.     Left side of head: No submental,  submandibular or tonsillar adenopathy.     Cervical: No cervical adenopathy.     Right cervical: No superficial, deep or posterior cervical adenopathy.    Left cervical: No superficial, deep or posterior cervical adenopathy.     Upper Body:     Right upper body: No supraclavicular or axillary adenopathy.     Left upper body: No supraclavicular or axillary adenopathy.  Skin:    General: Skin is warm.     Capillary Refill: Capillary  refill takes less than 2 seconds.     Findings: No rash.     Comments: Noted actinics  Neurological:     General: No focal deficit present.     Mental Status: She is alert and oriented to person, place, and time.     Cranial Nerves: Cranial nerves are intact. No cranial nerve deficit, dysarthria or facial asymmetry.     Sensory: Sensation is intact.     Deep Tendon Reflexes: Reflexes normal.  Psychiatric:        Mood and Affect: Mood is not anxious or depressed.        Behavior: Behavior is cooperative.     Wt Readings from Last 3 Encounters:  05/12/19 147 lb (66.7 kg)  02/10/18 160 lb (72.6 kg)  06/03/17 159 lb (72.1 kg)    BP 110/70   Pulse 88   Ht 5\' 6"  (1.676 m)   Wt 147 lb (66.7 kg)   BMI 23.73 kg/m   Assessment and Plan:  1. Breast cancer screening by mammogram Discussed and arranged. - MM 3D SCREEN BREAST BILATERAL; Future  2. Annual physical exam No subjective/objective concerns noted during history and physical.  Patient's previous encounters reviewed. JERSIE BEEL is a 53 y.o. female who presents today for her Complete Annual Exam. She feels well. She reports exercising . She reports she is sleeping well. Immunizations are reviewed and recommendations provided.   Age appropriate screening tests are discussed. Counseling given for risk factor reduction interventions.  Will check lipid panel and renal function panel. - Lipid panel - Renal Function Panel  3. Psoriasis Discussion of patient's diagnosis this seems to be more  consistent with seborrhea and psoriasis.  Exam noted to have plaques in the scalp or neck area.  We will refill: Temovate 0.05% to use on a as needed basis then encouraged to obtain Nizoral shampoo. - clobetasol cream (TEMOVATE) 0.05 %; Apply 1 application topically 2 (two) times daily.  Dispense: 30 g; Refill: 1

## 2019-05-12 NOTE — Patient Instructions (Addendum)
Ketoconazole shampoo What is this medicine? KETOCONAZOLE (kee toe KON na zole) is an antifungal medicine. It is used to treat certain kinds of fungal or yeast infections. This medicine may be used for other purposes; ask your health care provider or pharmacist if you have questions. COMMON BRAND NAME(S): Nizoral, Nizoral A-D What should I tell my health care provider before I take this medicine? They need to know if you have any of these conditions:  an unusual or allergic reaction to ketoconazole, itraconazole, miconazole, sulfites, other foods, dyes or preservatives  pregnant or trying to get pregnant  breast-feeding How should I use this medicine? This medicine is for external use only. Follow the directions on the prescription label. Wash your hands before and after use. Apply the shampoo to damp skin of the affected area of the skin or scalp. Use enough shampoo to cover the affected area and a wide margin surrounding the affected area. Work the shampoo into a Public relations account executive and leave on for 5 minutes. Rinse the treated area completely with water. Do not get the shampoo in your eyes. If you do, rinse out with plenty of cool tap water. Finish the full course prescribed by your doctor or health care professional even if you think your condition is better. Do not stop using except on the advice of your doctor or health care professional. Talk to your pediatrician regarding the use of this medicine in children. Special care may be needed. Overdosage: If you think you have taken too much of this medicine contact a poison control center or emergency room at once. NOTE: This medicine is only for you. Do not share this medicine with others. What if I miss a dose? If you miss a dose, use it as soon as you can. If it is almost time for your next dose, use only that dose. Do not use double or extra doses. What may interact with this medicine? Interactions are not expected. Do not use any other skin products  without telling your doctor or health care professional. This list may not describe all possible interactions. Give your health care provider a list of all the medicines, herbs, non-prescription drugs, or dietary supplements you use. Also tell them if you smoke, drink alcohol, or use illegal drugs. Some items may interact with your medicine. What should I watch for while using this medicine? Tell your doctor or health care professional if your symptoms do not begin to improve in 1 to 2 weeks. If your hair has been permanently waved the shampoo may remove the curl. What side effects may I notice from receiving this medicine? Side effects that you should report to your doctor or health care professional as soon as possible:  allergic reactions like skin rash, itching or hives, swelling of the face, lips, or tongue  pain, tingling, numbness Side effects that usually do not require medical attention (report to your doctor or health care professional if they continue or are bothersome):  dry skin  hair loss, hair discoloration or abnormal texture  skin irritation This list may not describe all possible side effects. Call your doctor for medical advice about side effects. You may report side effects to FDA at 1-800-FDA-1088. Where should I keep my medicine? Keep out of the reach of children. Store at room temperature between 20 to 25 degrees C (68 to 77 degrees F). Throw away any unused medicine after the expiration date. NOTE: This sheet is a summary. It may not cover all possible information. If  you have questions about this medicine, talk to your doctor, pharmacist, or health care provider.  2020 Elsevier/Gold Standard (2019-02-24 15:38:47) GUIDELINES FOR  LOW-CHOLESTEROL, LOW-TRIGLYCERIDE DIETS    FOODS TO USE   MEATS, FISH Choose lean meats (chicken, [place], veal, and non-fatty cuts of beef with excess fat trimmed; one serving = 3 oz of cooked meat). Also, fresh or frozen fish, canned  fish packed in water, and shellfish (lobster, crabs, shrimp, and oysters). Limit use to no more than one serving of one of these per week. Shellfish are high in cholesterol but low in saturated fat and should be used sparingly. Meats and fish should be broiled (pan or oven) or baked on a rack.  EGGS Egg substitutes and egg whites (use freely). Egg yolks (limit two per week).  FRUITS Eat three servings of fresh fruit per day (1 serving =  cup). Be sure to have at least one citrus fruit daily. Frozen and canned fruit with no sugar or syrup added may be used.  VEGETABLES Most vegetables are not limited (see next page). One dark-green (string beans, escarole) or one deep yellow (squash) vegetable is recommended daily. Cauliflower, broccoli, and celery, as well as potato skins, are recommended for their fiber content. (Fiber is associated with cholesterol reduction) It is preferable to steam vegetables, but they may be boiled, strained, or braised with polyunsaturated vegetable oil (see below).  BEANS Dried peas or beans (1 serving =  cup) may be used as a bread substitute.  NUTS Almonds, walnuts, and peanuts may be used sparingly  (1 serving = 1 Tablespoonful). Use pumpkin, sesame, or sunflower seeds.  BREADS, GRAINS One roll or one slice of whole grain or enriched bread may be used, or three soda crackers or four pieces of melba toast as a substitute. Spaghetti, rice or noodles ( cup) or  large ear of corn may be used as a bread substitute. In preparing these foods do not use butter or shortening, use soft margarine. Also use egg and sugar substitutes.  Choose high fiber grains, such as oats and whole wheat.  CEREALS Use  cup of hot cereal or  cup of cold cereal per day. Add a sugar substitute if desired, with 99% fat free or skim milk.  MILK PRODUCTS Always use 99% fat free or skim milk, dairy products such as low fat cheeses (farmer's uncreamed diet cottage), low-fat yogurt, and powdered skim milk.   FATS, OILS Use soft (not stick) margarine; vegetable oils that are high in polyunsaturated fats (such as safflower, sunflower, soybean, corn, and cottonseed). Always refrigerate meat drippings to harden the fat and remove it before preparing gravies  DESSERTS, SNACKS Limit to two servings per day; substitute each serving for a bread/cereal serving: ice milk, water sherbet (1/4 cup); unflavored gelatin or gelatin flavored with sugar substitute (1/3 cup); pudding prepared with skim milk (1/2 cup); egg white souffls; unbuttered popcorn (1  cups). Substitute carob for chocolate.  BEVERAGES Fresh fruit juices (limit 4 oz per day); black coffee, plain or herbal teas; soft drinks with sugar substitutes; club soda, preferably salt-free; cocoa made with skim milk or nonfat dried milk and water (sugar substitute added if desired); clear broth. Alcohol: limit two servings per day (see second page).  MISCELLANEOUS  You may use the following freely: vinegar, spices, herbs, nonfat bouillon, mustard, Worcestershire sauce, soy sauce, flavoring essence.                  GUIDELINES FOR  LOW-CHOLESTEROL, LOW  TRIGLYCERIDE DIETS    FOODS TO AVOID   MEATS, FISH Marbled beef, pork, bacon, sausage, and other pork products; fatty fowl (duck, goose); skin and fat of [place] and chicken; processed meats; luncheon meats (salami, bologna); frankfurters and fast-food hamburgers (theyre loaded with fat); organ meats (kidneys, liver); canned fish packed in oil.  EGGS Limit egg yolks to two per week.   FRUITS Coconuts (rich in saturated fats).  VEGETABLES Avoid avocados. Starchy vegetables (potatoes, corn, lima beans, dried peas, beans) may be used only if substitutes for a serving of bread or cereal. (Baked potato skin, however, is desirable for its fiber content.  BEANS Commercial baked beans with sugar and/or pork added.  NUTS Avoid nuts.  Limit peanuts and walnuts to one tablespoonful per day.  BREADS, GRAINS  Any baked goods with shortening and/or sugar. Commercial mixes with dried eggs and whole milk. Avoid sweet rolls, doughnuts, breakfast pastries (Danish), and sweetened packaged cereals (the added sugar converts readily to triglycerides).  MILK PRODUCTS Whole milk and whole-milk packaged goods; cream; ice cream; whole-milk puddings, yogurt, or cheeses; nondairy cream substitutes.  FATS, OILS Butter, lard, animal fats, bacon drippings, gravies, cream sauces as well as palm and coconut oils. All these are high in saturated fats. Examine labels on cholesterol free products for hydrogenated fats. (These are oils that have been hardened into solids and in the process have become saturated.)  DESSERTS, SNACKS Fried snack foods like potato chips; chocolate; candies in general; jams, jellies, syrups; whole- milk puddings; ice cream and milk sherbets; hydrogenated peanut butter.  BEVERAGES Sugared fruit juices and soft drinks; cocoa made with whole milk and/or sugar. When using alcohol (1 oz liquor, 5 oz beer, or 2  oz dry table wine per serving), one serving must be substituted for one bread or cereal serving (limit, two servings of alcohol per day).   SPECIAL NOTES    1. Remember that even non-limited foods should be used in moderation. 2. While on a cholesterol-lowering diet, be sure to avoid animal fats and marbled meats. 3. 3. While on a triglyceride-lowering diet, be sure to avoid sweets and to control the amount of carbohydrates you eat (starchy foods such as flour, bread, potatoes).While on a tri-glyceride-lowering diet, be sure to avoid sweets 4. Buy a good low-fat cookbook, such as the one published by the American Heart Association. 5. Consult your physician if you have any questions.               [facility] Low Glycemic Diet Plan   Low Glycemic Foods (20-49) Moderate Glycemic Foods (50-69) High Glycemic Foods (70-100)      Breakfast Creals Breakfast Cereals Breakfast Cereals   All Bran All-Bran Fruit'n Oats   Bran Buds Bran Chex   Cheerios Corn chex    Fiber One Oatmeal (not instant)   Just Right Mini-Wheats   Corn Flakes Cream of Wheat    Oat Bran Special K Swiss Muesli   Grape Nuts Grape Nut Flakes      Grits Nutri-Grain    Fruits and fruit juice: Fruits Puffed Rice Puffed Wheat    (Limit to 1-2 Servings per day) Banana (under-ride) Dates   Rice Chex Rice Krispies    Apples Apricots (fresh/dried)   Figs Grapes   Shredded Wheat Team    Blackberries Blueberries   Kiwi Mango   Total     Cherries Cranberries   Oranges Raisins     Peaches Pears    Fruits  Plums Prunes  Fruit Juices Pineapple Watermelon    Grapefruit Raspberries   Cranberry Juice Orange Juice   Banana (over-ripe)     Strawberries Tangerines      Apple Juice Grapefruit Juice   Beans and Legumes Beverages  Tomato Juice    [place]-type baked beans Sodas, sweet tea, pineapple juice   Canned pinto, kidney, or navy beans   Beans and Legumes (fresh-cooked) Green peas Vegetables  Black-eyed peas Butter Beans    Potato, baked, boiled, fried, mashed  Chick peas Lentils   Vegetables [place] fries  Green beans Lima beans   Beets Carrots   Canned or frozen corn  Kidney beans Navy beans   Sweet potato Yam   Parsnips  Pinto beans Snow peas   Corn on the cob Winter squash      Non-starchy vegetables Grains Breads  Asparagus, avocado, broccoli, cabbage Cornmeal Rice, brown   Most breads (white and whole grain)  cauliflower, celery, cucumber, greens Rice, white Couscous   Bagels Bread sticks    lettuce, mushrooms, peppers, tomatoes  Bread stuffing Kaiser roll    okra, onions, spinach, summer squash Pasta Dinner rolls   [employer], cheese     Grains Ravioli, meat filled Spaghetti, white   Grains  Barley Bulgur    Rice, instant Tapioca, with milk    Rye Wild rice   Nuts    Cashews Macadamia   Candy and most cookies  Nuts and oils    Almonds, peanuts, sunflower  seeds Snacks Snacks  hazelnuts, pecans, walnuts Chocolate Ice cream, lowfat   Donuts Corn chips    Oils that are liquid at room temperature Muffin Popcorn   Jelly beans Pretzels      Pastries  Dairy, fish, meat, soy, and eggs    Milk, skim Lowfat cheese    Restaurant and ethnic foods  Yogurt, lowfat, fruit sugar sweetened  Most [place] food (sugar in stir fry    or wok sauce)  Lean red meat Fish    Teriyaki-style meats and vegetables  Skinless chicken and [place], shellfish        Egg whites (up to 3 daily), Soy Products    Egg yolks (up to 7 or _____ per week)

## 2019-05-13 LAB — LIPID PANEL
Chol/HDL Ratio: 3.7 ratio (ref 0.0–4.4)
Cholesterol, Total: 231 mg/dL — ABNORMAL HIGH (ref 100–199)
HDL: 63 mg/dL (ref >39)
LDL Calculated: 137 mg/dL — ABNORMAL HIGH (ref 0–99)
Triglycerides: 155 mg/dL — ABNORMAL HIGH (ref 0–149)
VLDL Cholesterol Cal: 31 mg/dL (ref 5–40)

## 2019-05-13 LAB — RENAL FUNCTION PANEL
Albumin: 4.5 g/dL (ref 3.8–4.9)
BUN/Creatinine Ratio: 14 (ref 9–23)
BUN: 10 mg/dL (ref 6–24)
CO2: 25 mmol/L (ref 20–29)
Calcium: 9.7 mg/dL (ref 8.7–10.2)
Chloride: 102 mmol/L (ref 96–106)
Creatinine, Ser: 0.72 mg/dL (ref 0.57–1.00)
GFR calc Af Amer: 111 mL/min/{1.73_m2} (ref >59)
GFR calc non Af Amer: 96 mL/min/{1.73_m2} (ref >59)
Glucose: 81 mg/dL (ref 65–99)
Phosphorus: 4.8 mg/dL — ABNORMAL HIGH (ref 3.0–4.3)
Potassium: 4.3 mmol/L (ref 3.5–5.2)
Sodium: 142 mmol/L (ref 134–144)

## 2019-06-03 ENCOUNTER — Ambulatory Visit
Admission: RE | Admit: 2019-06-03 | Discharge: 2019-06-03 | Disposition: A | Discharge status: Home / Self Care | Payer: BLUE CROSS/BLUE SHIELD | Source: Ambulatory Visit | Attending: Family Medicine | Admitting: Family Medicine

## 2019-06-03 ENCOUNTER — Other Ambulatory Visit: Payer: Self-pay

## 2019-06-03 DIAGNOSIS — Z1231 Encounter for screening mammogram for malignant neoplasm of breast: Secondary | ICD-10-CM

## 2020-04-20 IMAGING — MG DIGITAL SCREENING BILATERAL MAMMOGRAM WITH TOMO AND CAD
8 series · 8 of 24 positions shown · non-contrast
Comparison: Previous exam(s).

CLINICAL DATA: Screening.

EXAM:
DIGITAL SCREENING BILATERAL MAMMOGRAM WITH TOMO AND CAD

[L MLO synth-2D]
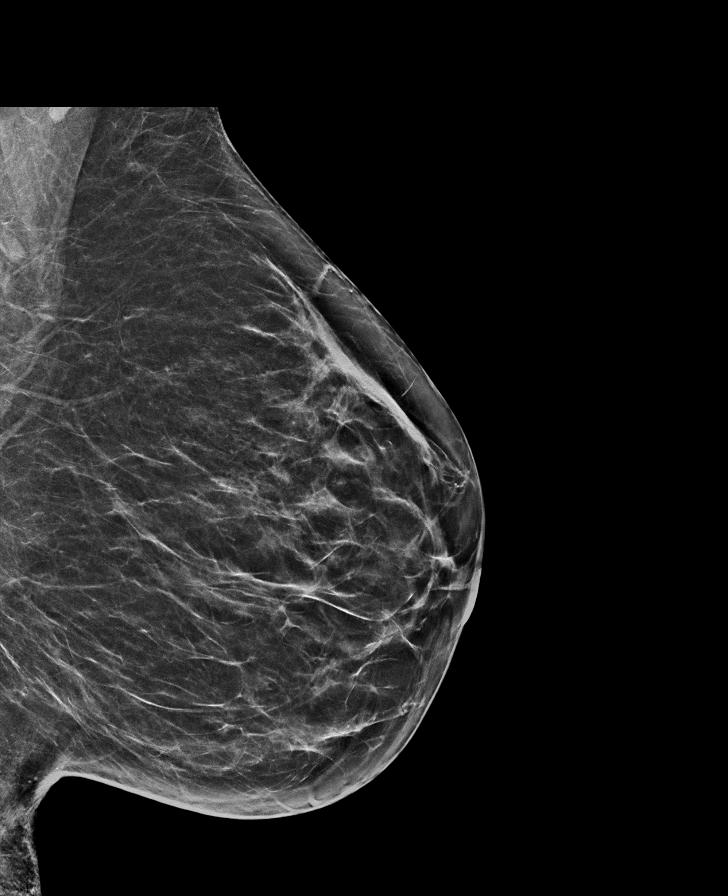

[R CC synth-2D]
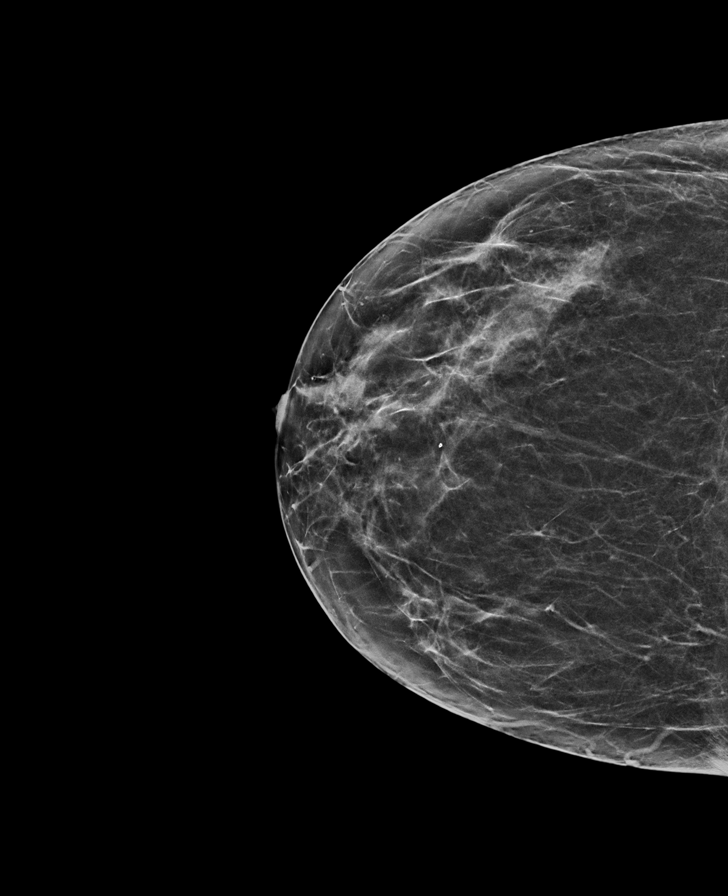

[R MLO synth-2D]
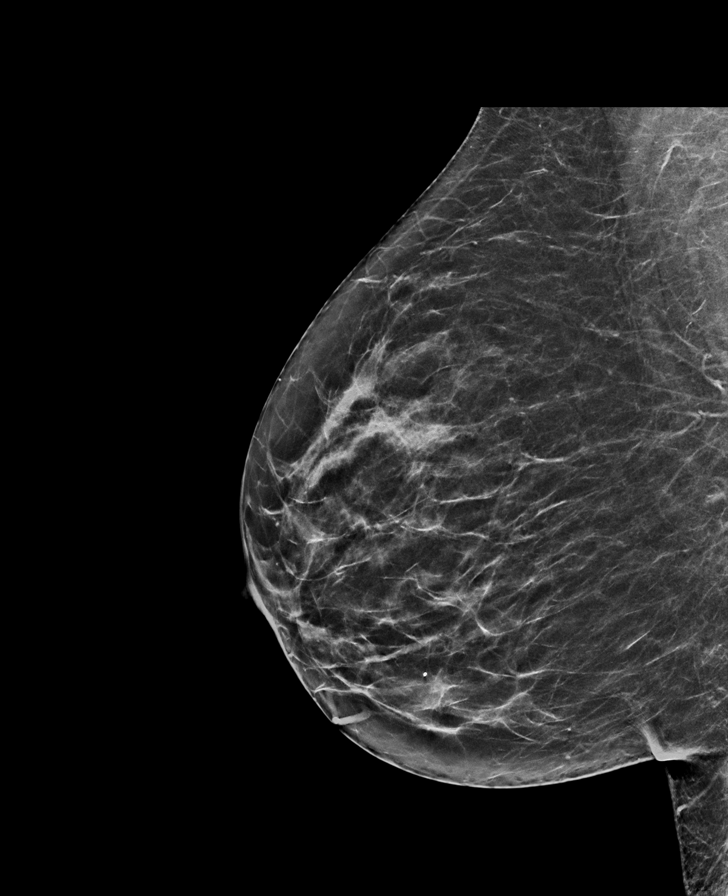

[L CC synth-2D]
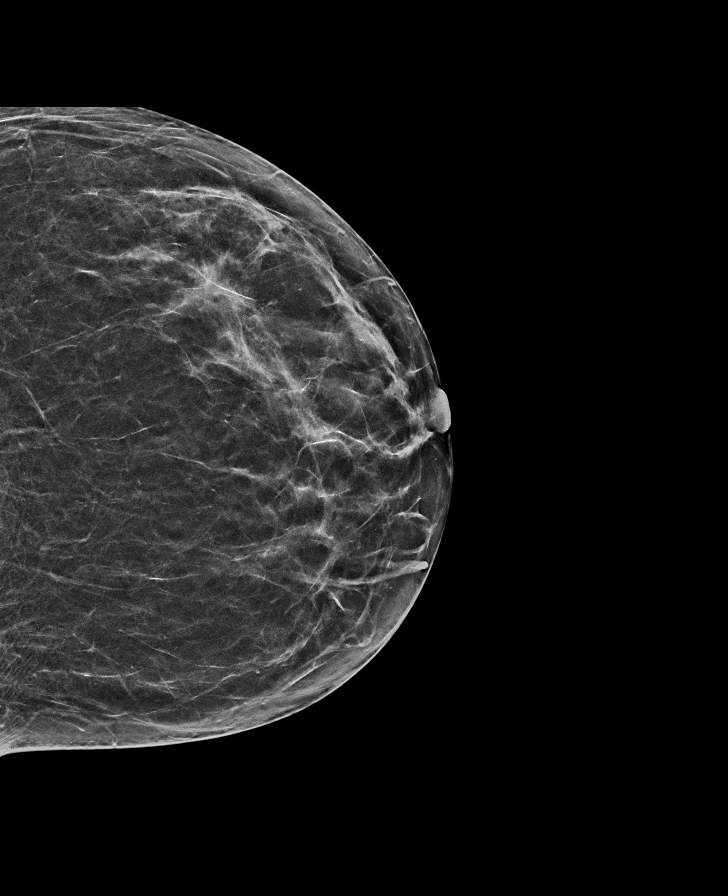

[L MLO tomo · tomo slice 33/65.0]
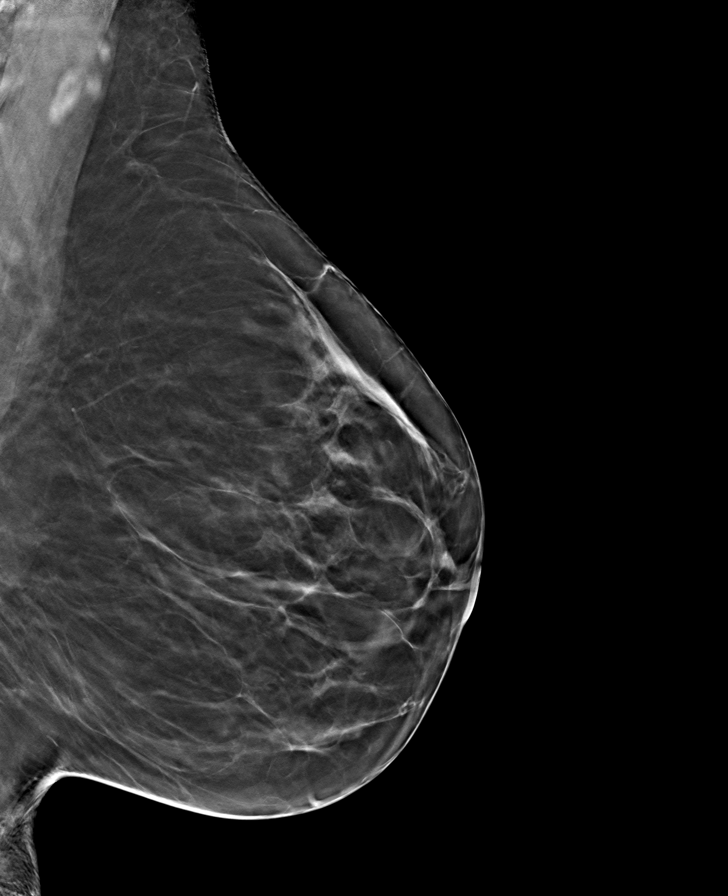

[R CC tomo · tomo slice 31/62.0]
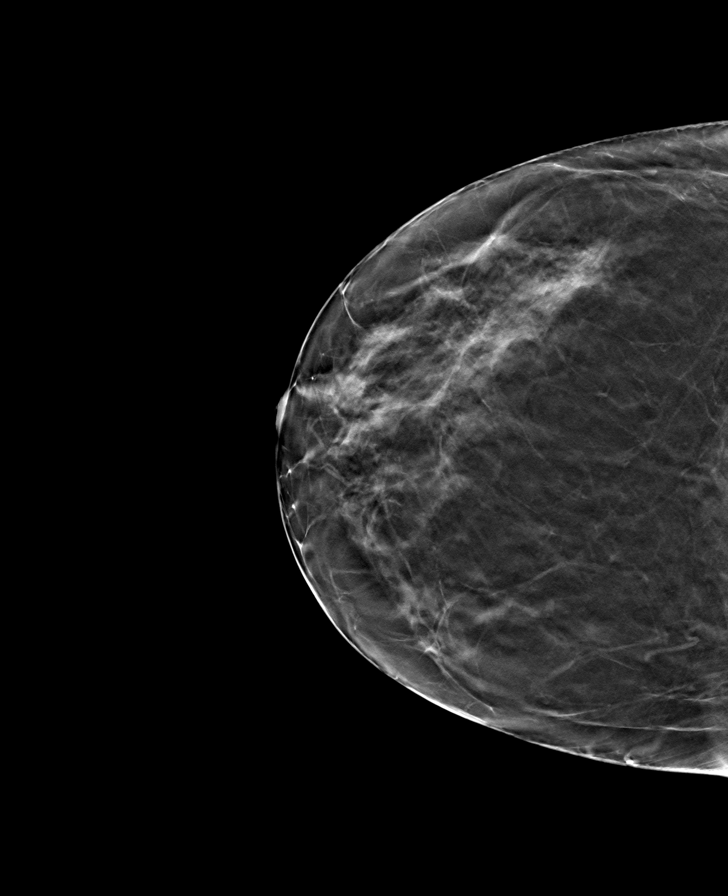

[R MLO tomo · tomo slice 31/62.0]
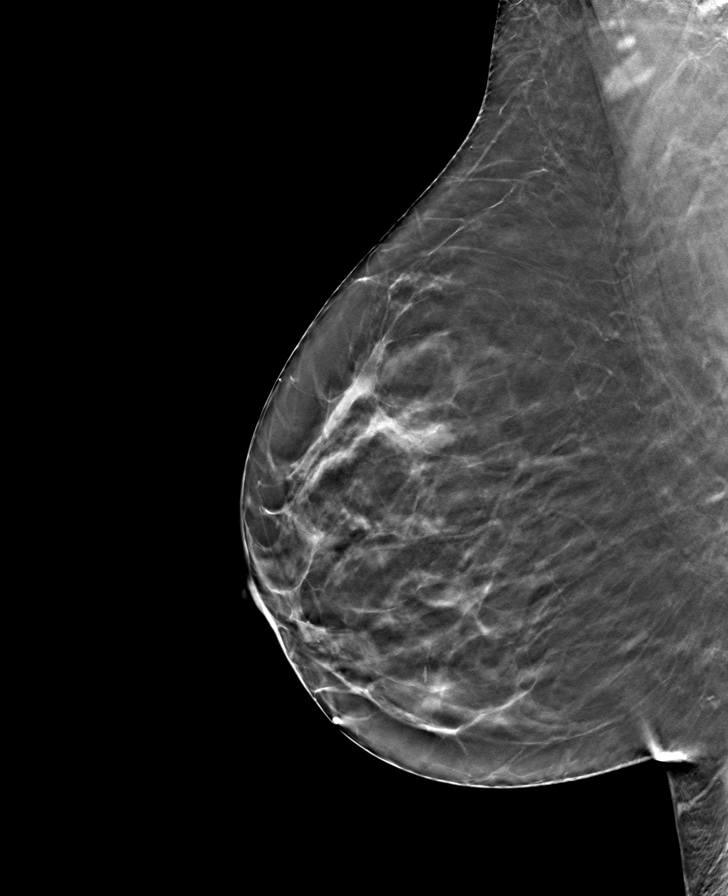

[L CC tomo · tomo slice 31/60.0]
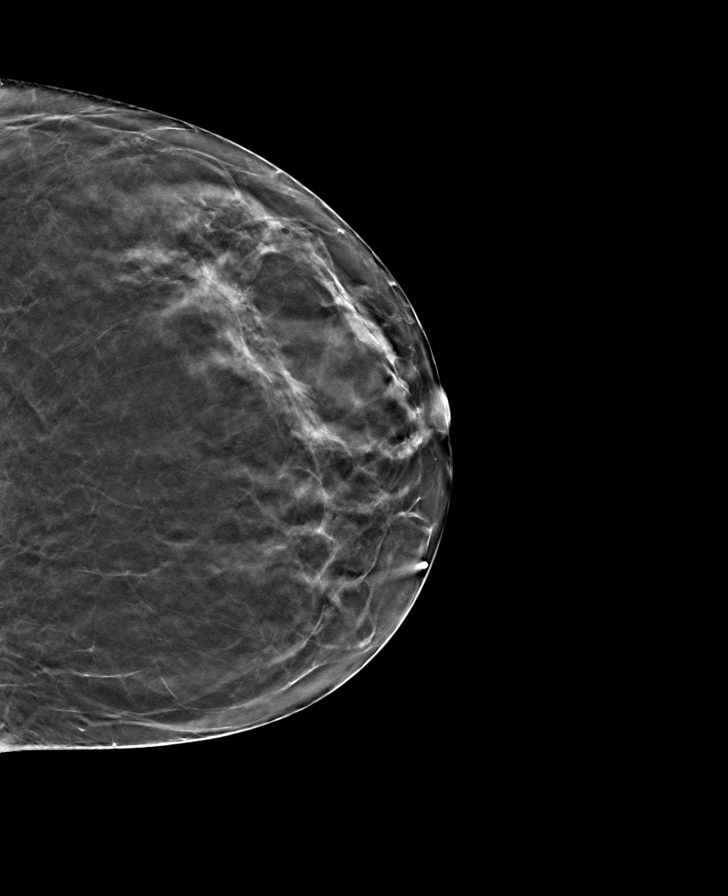

[8 of 24 positions shown; findings below may reference images not displayed]

ACR Breast Density Category c: The breast tissue is heterogeneously
dense, which may obscure small masses.
FINDINGS: There are no findings suspicious for malignancy. Images were
processed with CAD.
IMPRESSION: No mammographic evidence of malignancy. A result letter of this
screening mammogram will be mailed directly to the patient.

RECOMMENDATION:
Screening mammogram in one year. (Code:[id number])

BI-RADS CATEGORY  1: Negative.

## 2021-03-27 ENCOUNTER — Encounter: Payer: BLUE CROSS/BLUE SHIELD | Admitting: Family Medicine

## 2021-03-27 ENCOUNTER — Other Ambulatory Visit: Payer: Self-pay

## 2021-08-10 ENCOUNTER — Telehealth: Payer: Self-pay

## 2021-08-10 NOTE — Telephone Encounter (Signed)
Received a FAX from [facility] to request records for patient. She is now seeing Willaim Rayas, FMP for her Primary Care.  Faxed records, labs, and immunizations along with last pap and mammogram to requested FAX # at [phone].
